# Patient Record
Sex: Male | Born: 1995 | Race: Black or African American | Hispanic: No | Marital: Single | Smoking: Current some day smoker
Health system: Southern US, Community
[De-identification: ages and names within clinical notes are randomized; demographics above are authoritative.]

## PROBLEM LIST (undated history)

## (undated) ENCOUNTER — Ambulatory Visit (HOSPITAL_COMMUNITY): Admission: EM | Payer: Self-pay | Source: Home / Self Care

---
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
## 2017-09-21 ENCOUNTER — Emergency Department (HOSPITAL_BASED_OUTPATIENT_CLINIC_OR_DEPARTMENT_OTHER): Payer: Self-pay

## 2017-09-21 ENCOUNTER — Encounter (HOSPITAL_BASED_OUTPATIENT_CLINIC_OR_DEPARTMENT_OTHER): Payer: Self-pay | Admitting: *Deleted

## 2017-09-21 ENCOUNTER — Other Ambulatory Visit: Payer: Self-pay

## 2017-09-21 ENCOUNTER — Emergency Department (HOSPITAL_BASED_OUTPATIENT_CLINIC_OR_DEPARTMENT_OTHER)
Admission: EM | Admit: 2017-09-21 | Discharge: 2017-09-22 | Disposition: A | Discharge status: Home / Self Care | Payer: Self-pay | Attending: Emergency Medicine | Admitting: Emergency Medicine

## 2017-09-21 DIAGNOSIS — F17200 Nicotine dependence, unspecified, uncomplicated: Secondary | ICD-10-CM | POA: Insufficient documentation

## 2017-09-21 DIAGNOSIS — R0602 Shortness of breath: Secondary | ICD-10-CM | POA: Insufficient documentation

## 2017-09-21 LAB — RAPID URINE DRUG SCREEN, HOSP PERFORMED
AMPHETAMINES: NOT DETECTED
BENZODIAZEPINES: NOT DETECTED
Cocaine: NOT DETECTED
OPIATES: NOT DETECTED
TETRAHYDROCANNABINOL: POSITIVE — AB

## 2017-09-21 LAB — CBC WITH DIFFERENTIAL/PLATELET
BASOS PCT: 1 %
Basophils Absolute: 0 10*3/uL (ref 0.0–0.1)
Eosinophils Absolute: 0.2 10*3/uL (ref 0.0–0.7)
Eosinophils Relative: 2 %
HEMATOCRIT: 39.1 % (ref 39.0–52.0)
HEMOGLOBIN: 14.4 g/dL (ref 13.0–17.0)
Lymphocytes Relative: 31 %
Lymphs Abs: 2 10*3/uL (ref 0.7–4.0)
MCH: 31.8 pg (ref 26.0–34.0)
MCHC: 36.8 g/dL — ABNORMAL HIGH (ref 30.0–36.0)
MCV: 86.3 fL (ref 78.0–100.0)
Monocytes Absolute: 0.8 10*3/uL (ref 0.1–1.0)
Monocytes Relative: 12 %
NEUTROS PCT: 54 %
Neutro Abs: 3.6 10*3/uL (ref 1.7–7.7)
Platelets: 206 10*3/uL (ref 150–400)
RBC: 4.53 MIL/uL (ref 4.22–5.81)
RDW: 11.8 % (ref 11.5–15.5)
WBC: 6.6 10*3/uL (ref 4.0–10.5)

## 2017-09-21 LAB — BASIC METABOLIC PANEL
Anion gap: 9 (ref 5–15)
BUN: 14 mg/dL (ref 6–20)
CALCIUM: 9.5 mg/dL (ref 8.9–10.3)
CHLORIDE: 101 mmol/L (ref 98–111)
CO2: 25 mmol/L (ref 22–32)
CREATININE: 1.06 mg/dL (ref 0.61–1.24)
GFR calc non Af Amer: >60 mL/min (ref >60)
Glucose, Bld: 82 mg/dL (ref 70–99)
Potassium: 3.5 mmol/L (ref 3.5–5.1)
SODIUM: 135 mmol/L (ref 135–145)

## 2017-09-21 LAB — D-DIMER, QUANTITATIVE: D-Dimer, Quant: 0.88 ug/mL-FEU — ABNORMAL HIGH (ref 0.00–0.50)

## 2017-09-21 LAB — TROPONIN I

## 2017-09-21 MED ORDER — IOPAMIDOL (ISOVUE-370) INJECTION 76%
100.0000 mL | Freq: Once | INTRAVENOUS | Status: AC | PRN
  Administered 2017-09-21: 100 mL via INTRAVENOUS

## 2017-09-21 NOTE — ED Provider Notes (Signed)
[facility] EMERGENCY DEPARTMENT Provider Note   CSN: [id number] Arrival date & time: 09/21/17  2027     History   Chief Complaint Chief Complaint  Patient presents with  . Shortness of Breath    HPI Jacob Gates is a 22 y.o. male who presents with SOB. No significant PMH. 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 He feels like he has to take deeper breaths to get in enough air. He went home from work and rested but still complained of chest pain and SOB to his mom therefore they decided to come to the ED. He also feels like he has chest congestion but denies fever, chills, URI symptoms, cough, or wheezing. He is an occasional smoker. He has had this symptom before a couple years ago which resolved on its own however he remembers having more cold symptoms at that time. No recent surgery/travel/immobilization, hx of cancer, leg swelling, hemptysis, prior DVT/PE, or hormone use.   HPI  History reviewed. No pertinent past medical history.  There are no active problems to display for this patient.   History reviewed. No pertinent surgical history.      Home Medications    Prior to Admission medications   Medication Sig Start Date End Date Taking? Authorizing Provider  naproxen sodium (ANAPROX) 220 MG tablet Take 440 mg by mouth 2 (two) times daily as needed (pain).    [provider]    Family History No family history on file.  Social History Social History   Tobacco Use  . Smoking status: Current Some Day Smoker  . Smokeless tobacco: Never Used  Substance Use Topics  . Alcohol use: No  . Drug use: No     Allergies   Other   Review of Systems Review of Systems  Constitutional: Negative for chills and fever.  HENT: Positive for congestion.   Respiratory: Positive for shortness of breath. Negative for cough, chest tightness and wheezing.   Cardiovascular:  Positive for palpitations. Negative for chest pain and leg swelling.  Gastrointestinal: Negative for abdominal pain.  Neurological: Negative for syncope.     Physical Exam Updated Vital Signs BP (!) 151/94 (BP Location: Right Arm)   Pulse 67   Temp 98.1 F (36.7 C) (Oral)   Resp 20   Ht 5\' 10"  (1.778 m)   Wt 83.5 kg (184 lb 1 oz)   SpO2 100%   BMI 26.41 kg/m   Physical Exam  Constitutional: He is oriented to person, place, and time. He appears well-developed and well-nourished. No distress.  HENT:  Head: Normocephalic and atraumatic.  Right Ear: Hearing, tympanic membrane, external ear and ear canal normal.  Left Ear: Hearing, tympanic membrane, external ear and ear canal normal.  Nose: Nose normal.  Mouth/Throat: Uvula is midline, oropharynx is clear and moist and mucous membranes are normal.  Eyes: Pupils are equal, round, and reactive to light. Conjunctivae are normal. Right eye exhibits no discharge. Left eye exhibits no discharge. No scleral icterus.  Neck: Normal range of motion.  Cardiovascular: Normal rate and regular rhythm.  Pulmonary/Chest: Effort normal and breath sounds normal. No respiratory distress.  Abdominal: He exhibits no distension.  Neurological: He is alert and oriented to person, place, and time.  Skin: Skin is warm and dry.  Psychiatric: He has a normal mood and affect. His behavior is normal.  Nursing note and vitals  reviewed.    ED Treatments / Results  Labs (all labs ordered are listed, but only abnormal results are displayed) Labs Reviewed  CBC WITH DIFFERENTIAL/PLATELET - Abnormal; Notable for the following components:      Result Value   MCHC 36.8 (*)    All other components within normal limits  D-DIMER, QUANTITATIVE (NOT AT [facility]) - Abnormal; Notable for the following components:   D-Dimer, Quant 0.88 (*)    All other components within normal limits  RAPID URINE DRUG SCREEN, HOSP PERFORMED - Abnormal; Notable for the following  components:   Tetrahydrocannabinol POSITIVE (*)    Barbiturates   (*)    Value: Result not available. Reagent lot number recalled by manufacturer.   All other components within normal limits  BASIC METABOLIC PANEL  TROPONIN I    EKG EKG Interpretation  Date/Time:  Thursday September 21 2017 21:35:17 EDT Ventricular Rate:  69 PR Interval:    QRS Duration: 84 QT Interval:  383 QTC Calculation: 411 R Axis:   43 Text Interpretation:  Sinus rhythm Repol abnrm suggests ischemia, inferior leads Borderline ST elevation, anterolateral leads No previous tracing Confirmed by Gwyneth SproutPlunkett, Whitney ([id number]) on 09/21/2017 9:47:40 PM   Radiology Dg Chest 2 View  Result Date: 09/21/2017 CLINICAL DATA:  Chest mucus and difficulty breathing x4 hours. EXAM: CHEST - 2 VIEW COMPARISON:  None. FINDINGS: The heart size and mediastinal contours are within normal limits. Both lungs are clear. The visualized skeletal structures are unremarkable. IMPRESSION: No active cardiopulmonary disease. Electronically Signed   By: Tollie Ethavid  Kwon M.D.   On: 09/21/2017 21:53   Ct Angio Chest Pe W/cm &/or Wo Cm  Result Date: 09/21/2017 CLINICAL DATA:  Difficulty breathing, mucus with congestion x7 hours. Unable cough. EXAM: CT ANGIOGRAPHY CHEST WITH CONTRAST TECHNIQUE: Multidetector CT imaging of the chest was performed using the standard protocol during bolus administration of intravenous contrast. Multiplanar CT image reconstructions and MIPs were obtained to evaluate the vascular anatomy. CONTRAST:  100mL ISOVUE-370 IOPAMIDOL (ISOVUE-370) INJECTION 76% COMPARISON:  None. FINDINGS: Cardiovascular: The study is of quality for the evaluation of pulmonary embolism. There are no filling defects in the central, lobar, segmental or subsegmental pulmonary artery branches to suggest acute pulmonary embolism. Great vessels are normal in course and caliber. Normal heart size. No significant pericardial fluid/thickening. Mediastinum/Nodes: No  discrete thyroid nodules. Unremarkable esophagus. No pathologically enlarged axillary, mediastinal or hilar lymph nodes. Lungs/Pleura: No pneumothorax. No pleural effusion. Lungs are clear without air trapping. No peribronchial thickening is identified. No mucus inspissation is seen. Upper abdomen: Unremarkable. Musculoskeletal:  No aggressive appearing focal osseous lesions. Review of the MIP images confirms the above findings. IMPRESSION: No pulmonary embolus. No acute pulmonary edema or consolidation. No pneumothorax. Electronically Signed   By: Tollie Ethavid  Kwon M.D.   On: 09/21/2017 23:09    Procedures Procedures (including critical care time)  Medications Ordered in ED Medications - No data to display   Initial Impression / Assessment and Plan / ED Course  I have reviewed the triage vital signs and the nursing notes.  Pertinent labs & imaging results that were available during my care of the patient were reviewed by me and considered in my medical decision making (see chart for details).  22 year old male presents with chest pain/SOB for one day. He is mildly hypertensive but otherwise vitals are normal. He is very well appearing on exam. Heart is regular rate and rhythm. Lungs are CTA. Unclear etiology of his symptoms. He reports congestion  but has not had a fever, URI symptoms or cough. He denies chest pain to me although mom states he told her about chest pain. He is PERC negative so I have low suspicion for PE. Will obtain EKG and CXR  EKG is SR with ST depressions. Discussed with Dr. Anitra Lauth. Will add labs, troponin, d-dimer. CXR is negative  Labs are normal. Troponin is normal. D-dimer is mildly elevated. UDS positive for THC. CTA chest ordered.  CT is negative for PE, pneumonia, pneumothorax. Discussed results with the patient and his mother. Ambulatory sats stayed >90%. We will have him try OTC cough/cold medicine to see if this helps his symptoms. They are agreeable.  Final Clinical  Impressions(s) / ED Diagnoses   Final diagnoses:  Shortness of breath    ED Discharge Orders    None       Bethel Born, PA-C 09/21/17 2348    Gwyneth Sprout, MD 09/22/17 1341

## 2017-09-21 NOTE — ED Notes (Signed)
Patient transported to X-ray 

## 2017-09-21 NOTE — Discharge Instructions (Signed)
Please try over the counter medicines for cough/cold such as Mucinex or Robitussin  Return if you are worsening

## 2017-09-21 NOTE — ED Triage Notes (Signed)
States he has chest mucus but can't cough it up. States when he inhales and exhales it takes more effort than normal for him. No distress.

## 2017-09-21 NOTE — ED Notes (Signed)
Ambulated patient 2 laps around the department; o2 sats varied from 90-100 % on room air; no acute distress noted during ambulation.

## 2018-02-09 ENCOUNTER — Ambulatory Visit (HOSPITAL_COMMUNITY)
Admission: EM | Admit: 2018-02-09 | Discharge: 2018-02-09 | Disposition: A | Discharge status: Home / Self Care | Payer: Self-pay | Attending: Internal Medicine | Admitting: Internal Medicine

## 2018-02-09 ENCOUNTER — Other Ambulatory Visit: Payer: Self-pay

## 2018-02-09 ENCOUNTER — Encounter (HOSPITAL_COMMUNITY): Payer: Self-pay | Admitting: Emergency Medicine

## 2018-02-09 ENCOUNTER — Ambulatory Visit (INDEPENDENT_AMBULATORY_CARE_PROVIDER_SITE_OTHER): Payer: Self-pay

## 2018-02-09 DIAGNOSIS — R079 Chest pain, unspecified: Secondary | ICD-10-CM

## 2018-02-09 DIAGNOSIS — F17210 Nicotine dependence, cigarettes, uncomplicated: Secondary | ICD-10-CM

## 2018-02-09 DIAGNOSIS — R0602 Shortness of breath: Secondary | ICD-10-CM

## 2018-02-09 MED ORDER — OMEPRAZOLE 20 MG PO CPDR: 20.0000 mg | DELAYED_RELEASE_CAPSULE | Freq: Every day | ORAL | 0 refills | Status: AC

## 2018-02-09 NOTE — ED Provider Notes (Signed)
[facility]    CSN: [id number] Arrival date & time: 02/09/18  1209     History   Chief Complaint Chief Complaint  Patient presents with  . Chest Pain  . diaphoresis    HPI Jacob Gates is a 22 y.o. male.   Shaarav presents with complaints of sternal chest pain which radiates to his back, sharp. Has been intermittent for the past 1-2 weeks. Seems to be worsening. Denies current pain. Smoking worsens it. Smokes approximately every other day. He has been working out, feels this helps his symptoms. While working out he feels his heart will beat fast, but improves with rest. No abdominal pain. No nausea or vomiting. Sometimes will feel shortness of breath like he needs to take a deep breath. Eating doesn't worsen symptoms. Doesn't wake him from sleep or get worse at night. No cough or congestion. No sore throat. Denies  Recent travel. No leg pain or swelling. No personal or family cardiac history or history of blood clots. Denies any previous similar, however on chart review 08/2017 appears to have had similar complaint with troponins, ekg and CTA, all which were negative. Hasn't taken any medications for symptoms. States he does get sweaty at night at times. Denies use of illicit drugs, doesn't drink alcohol. Takes biotin, no other medications. Without contributing medical history.       ROS per HPI.      History reviewed. No pertinent past medical history.  There are no active problems to display for this patient.   History reviewed. No pertinent surgical history.     Home Medications    Prior to Admission medications   Medication Sig Start Date End Date Taking? Authorizing Provider  Biotin 1 MG CAPS Take by mouth.   Yes [provider]  naproxen sodium (ANAPROX) 220 MG tablet Take 440 mg by mouth 2 (two) times daily as needed (pain).    [provider]  omeprazole (PRILOSEC) 20 MG capsule Take 1 capsule (20 mg total) by mouth daily. 02/09/18    Georgetta Haber, NP    Family History History reviewed. No pertinent family history.  Social History Social History   Tobacco Use  . Smoking status: Current Some Day Smoker  . Smokeless tobacco: Never Used  Substance Use Topics  . Alcohol use: No  . Drug use: Yes    Frequency: 4.0 times per week    Types: Marijuana     Allergies   Other   Review of Systems Review of Systems   Physical Exam Triage Vital Signs ED Triage Vitals  Enc Vitals Group     BP 02/09/18 1221 (!) 144/82     Pulse Rate 02/09/18 1221 92     Resp --      Temp 02/09/18 1221 97.7 F (36.5 C)     Temp Source 02/09/18 1221 Oral     SpO2 02/09/18 1221 98 %     Weight --      Height --      Head Circumference --      Peak Flow --      Pain Score 02/09/18 1219 0     Pain Loc --      Pain Edu? --      Excl. in GC? --    No data found.  Updated Vital Signs BP (!) 144/82 (BP Location: Left Arm)   Pulse 92   Temp 97.7 F (36.5 C) (Oral)   SpO2 98%    Physical  Exam  Constitutional: He is oriented to person, place, and time. He appears well-developed and well-nourished.  Cardiovascular: Normal rate, regular rhythm and normal pulses.  Pulmonary/Chest: Effort normal and breath sounds normal. He exhibits no tenderness.  Abdominal: Soft. Bowel sounds are normal. There is no tenderness.  Neurological: He is alert and oriented to person, place, and time.  Skin: Skin is warm and dry.   EKG NSR rate 70. stelvation noted, does not appear changed from previous ekg, appears consistent with early repolarization  UC Treatments / Results  Labs (all labs ordered are listed, but only abnormal results are displayed) Labs Reviewed - No data to display  EKG None  Radiology Dg Chest 2 View  Result Date: 02/09/2018 CLINICAL DATA:  Chest pain. EXAM: CHEST - 2 VIEW COMPARISON:  CT 09/21/2017. FINDINGS: Mediastinum and hilar structures normal. Lungs are clear. No focal infiltrate. No pleural effusion or  pneumothorax. Heart size normal. No acute bony abnormality. IMPRESSION: No acute cardiopulmonary disease. Electronically Signed   By: Maisie Fushomas  Register   On: 02/09/2018 13:43    Procedures Procedures (including critical care time)  Medications Ordered in UC Medications - No data to display  Initial Impression / Assessment and Plan / UC Course  I have reviewed the triage vital signs and the nursing notes.  Pertinent labs & imaging results that were available during my care of the patient were reviewed by me and considered in my medical decision making (see chart for details).     Non toxic in appearance. No current symptoms. Appears to have had similar symptoms in June of this year with negative work up in ER. No changes to ekg here today, chest xray reassuring. In discussed with patient he later then endorses some reflux symptoms, will start omeprazole daily to see if this helps with symptoms. Do recommend follow up with PCP for recheck as may need cardiology referral if symptoms persist. Return precautions provided. Patient verbalized understanding and agreeable to plan.  Ambulatory out of clinic without difficulty.    Final Clinical Impressions(s) / UC Diagnoses   Final diagnoses:  Nonspecific chest pain     Discharge Instructions     Your ekg is unchanged from your last visit. Chest xray without acute abnormalities at this time.  Reflux symptoms can at times cause chest pain which radiates to back, therefore I do think its worth starting daily omeprazole.  Please follow up with a primary care provider for recheck of your symptoms as you may need referral to cardiology for further evaluation.  If develop worsening of pain, shortness of breath , difficulty breathing, feeling like will pass out, sweating at time of chest pain, or otherwise worsening please go to the Er.     ED Prescriptions    Medication Sig Dispense Auth. Provider   omeprazole (PRILOSEC) 20 MG capsule Take 1  capsule (20 mg total) by mouth daily. 30 capsule Georgetta HaberBurky, Natalie B, NP     Controlled Substance Prescriptions [place] Controlled Substance Registry consulted? Not Applicable   Georgetta HaberBurky, Natalie B, NP 02/09/18 1354

## 2018-02-09 NOTE — Discharge Instructions (Addendum)
Your ekg is unchanged from your last visit. Chest xray without acute abnormalities at this time.  Reflux symptoms can at times cause chest pain which radiates to back, therefore I do think its worth starting daily omeprazole.  Please follow up with a primary care provider for recheck of your symptoms as you may need referral to cardiology for further evaluation.  If develop worsening of pain, shortness of breath , difficulty breathing, feeling like will pass out, sweating at time of chest pain, or otherwise worsening please go to the Er.

## 2018-02-09 NOTE — ED Triage Notes (Signed)
Pt reports atypical intermittent centralized chest pain that sometimes radiates to his back for two weeks.  He describes them as sharp and lasting about 5-10 minutes.  He will drink water and rest and they will go away.  He also reports waking up diaphoretic but not related to the CP.  He also feels like he needs to take real deep breaths to catch his breath, but again, not related to the CP

## 2018-09-21 ENCOUNTER — Emergency Department (HOSPITAL_COMMUNITY)
Admission: EM | Admit: 2018-09-21 | Discharge: 2018-09-21 | Disposition: A | Discharge status: Home / Self Care | Payer: BLUE CROSS/BLUE SHIELD | Attending: Emergency Medicine | Admitting: Emergency Medicine

## 2018-09-21 ENCOUNTER — Encounter (HOSPITAL_COMMUNITY): Payer: Self-pay | Admitting: Emergency Medicine

## 2018-09-21 ENCOUNTER — Other Ambulatory Visit: Payer: Self-pay

## 2018-09-21 DIAGNOSIS — F1210 Cannabis abuse, uncomplicated: Secondary | ICD-10-CM | POA: Insufficient documentation

## 2018-09-21 DIAGNOSIS — Z202 Contact with and (suspected) exposure to infections with a predominantly sexual mode of transmission: Secondary | ICD-10-CM

## 2018-09-21 DIAGNOSIS — F17200 Nicotine dependence, unspecified, uncomplicated: Secondary | ICD-10-CM | POA: Diagnosis not present

## 2018-09-21 DIAGNOSIS — Z79899 Other long term (current) drug therapy: Secondary | ICD-10-CM | POA: Diagnosis not present

## 2018-09-21 MED ORDER — CEFTRIAXONE SODIUM 250 MG IJ SOLR
250.0000 mg | Freq: Once | INTRAMUSCULAR | Status: AC
  Administered 2018-09-21: 08:00:00 250 mg via INTRAMUSCULAR
  Filled 2018-09-21: qty 250

## 2018-09-21 MED ORDER — LIDOCAINE HCL (PF) 1 % IJ SOLN
INTRAMUSCULAR | Status: AC
  Filled 2018-09-21: qty 5

## 2018-09-21 MED ORDER — METRONIDAZOLE 500 MG PO TABS
2000.0000 mg | ORAL_TABLET | Freq: Once | ORAL | Status: AC
  Administered 2018-09-21: 2000 mg via ORAL
  Filled 2018-09-21: qty 4

## 2018-09-21 MED ORDER — AZITHROMYCIN 250 MG PO TABS
1000.0000 mg | ORAL_TABLET | Freq: Once | ORAL | Status: AC
  Administered 2018-09-21: 1000 mg via ORAL
  Filled 2018-09-21: qty 4

## 2018-09-21 NOTE — ED Triage Notes (Signed)
Pt reports girlfriend with chlamydia infection. Would like STD testing.

## 2018-09-21 NOTE — ED Provider Notes (Signed)
[facility] EMERGENCY DEPARTMENT Provider Note   CSN: [id number] Arrival date & time: 09/21/18  4709     History   Chief Complaint Chief Complaint  Patient presents with  . Exposure to STD    HPI Jacob Gates is a 23 y.o. male.     HPI Patient presents to the emergency department with exposure to chlamydia.  Patient states his girlfriend informed him that she was tested and was positive for chlamydia.  Patient states that nothing seems to make the condition better or worse.  The patient states he he has had no symptoms at this time.  Patient denies abdominal pain, nausea, vomiting, dysuria,, weakness, dizziness, near-syncope or syncope. History reviewed. No pertinent past medical history.  There are no active problems to display for this patient.   History reviewed. No pertinent surgical history.      Home Medications    Prior to Admission medications   Medication Sig Start Date End Date Taking? Authorizing Provider  Biotin 1 MG CAPS Take by mouth.    [provider]  naproxen sodium (ANAPROX) 220 MG tablet Take 440 mg by mouth 2 (two) times daily as needed (pain).    [provider]  omeprazole (PRILOSEC) 20 MG capsule Take 1 capsule (20 mg total) by mouth daily. 02/09/18   Zigmund Gottron, NP    Family History History reviewed. No pertinent family history.  Social History Social History   Tobacco Use  . Smoking status: Current Some Day Smoker  . Smokeless tobacco: Never Used  Substance Use Topics  . Alcohol use: No  . Drug use: Yes    Frequency: 4.0 times per week    Types: Marijuana     Allergies   Other   Review of Systems Review of Systems All other systems negative except as documented in the HPI. All pertinent positives and negatives as reviewed in the HPI.  Physical Exam Updated Vital Signs BP 140/89 (BP Location: Right Arm)   Pulse 62   Temp 98.9 F (37.2 C) (Oral)   Resp 16   Ht 5\' 11"  (1.803 m)    Wt 88.5 kg   SpO2 100%   BMI 27.20 kg/m   Physical Exam Vitals signs and nursing note reviewed.  Constitutional:      General: He is not in acute distress.    Appearance: He is well-developed.  HENT:     Head: Normocephalic and atraumatic.  Eyes:     Pupils: Pupils are equal, round, and reactive to light.  Pulmonary:     Effort: Pulmonary effort is normal.  Genitourinary:    Penis: Normal.      Scrotum/Testes: Normal.  Skin:    General: Skin is warm and dry.  Neurological:     Mental Status: He is alert and oriented to person, place, and time.      ED Treatments / Results  Labs (all labs ordered are listed, but only abnormal results are displayed) Labs Reviewed - No data to display  EKG    Radiology No results found.  Procedures Procedures (including critical care time)  Medications Ordered in ED Medications - No data to display   Initial Impression / Assessment and Plan / ED Course  I have reviewed the triage vital signs and the nursing notes.  Pertinent labs & imaging results that were available during my care of the patient were reviewed by me and considered in my medical decision making (see chart for details).  The patient will be empirically treated for STDs.  Have advised patient to return here for any worsening in his condition.  Splane to the patient that he will need to see him sexual contact for the next 7 days.  Final Clinical Impressions(s) / ED Diagnoses   Final diagnoses:  None    ED Discharge Orders    None       Charlestine NightLawyer, Tai Skelly, PA-C 09/22/18 [id number]    Pricilla LovelessGoldston, Scott, MD 09/25/18 [phone]

## 2018-09-21 NOTE — Discharge Instructions (Addendum)
Return here as needed.  Follow-up with the health department as needed.  Avoid any sexual contact over the next 7 days.

## 2018-09-25 ENCOUNTER — Other Ambulatory Visit: Payer: Self-pay

## 2018-09-25 ENCOUNTER — Emergency Department (HOSPITAL_COMMUNITY)
Admission: EM | Admit: 2018-09-25 | Discharge: 2018-09-25 | Disposition: A | Discharge status: Home / Self Care | Payer: BLUE CROSS/BLUE SHIELD | Attending: Emergency Medicine | Admitting: Emergency Medicine

## 2018-09-25 DIAGNOSIS — F17200 Nicotine dependence, unspecified, uncomplicated: Secondary | ICD-10-CM | POA: Diagnosis not present

## 2018-09-25 DIAGNOSIS — R197 Diarrhea, unspecified: Secondary | ICD-10-CM | POA: Insufficient documentation

## 2018-09-25 LAB — CBC WITH DIFFERENTIAL/PLATELET
Abs Immature Granulocytes: 0.01 10*3/uL (ref 0.00–0.07)
Basophils Absolute: 0 10*3/uL (ref 0.0–0.1)
Basophils Relative: 0 %
Eosinophils Absolute: 0.2 10*3/uL (ref 0.0–0.5)
Eosinophils Relative: 3 %
HCT: 41.7 % (ref 39.0–52.0)
Hemoglobin: 14.6 g/dL (ref 13.0–17.0)
Immature Granulocytes: 0 %
Lymphocytes Relative: 19 %
Lymphs Abs: 1.2 10*3/uL (ref 0.7–4.0)
MCH: 31.3 pg (ref 26.0–34.0)
MCHC: 35 g/dL (ref 30.0–36.0)
MCV: 89.5 fL (ref 80.0–100.0)
Monocytes Absolute: 0.6 10*3/uL (ref 0.1–1.0)
Monocytes Relative: 10 %
Neutro Abs: 4.2 10*3/uL (ref 1.7–7.7)
Neutrophils Relative %: 68 %
Platelets: 209 10*3/uL (ref 150–400)
RBC: 4.66 MIL/uL (ref 4.22–5.81)
RDW: 12 % (ref 11.5–15.5)
WBC: 6.2 10*3/uL (ref 4.0–10.5)
nRBC: 0 % (ref 0.0–0.2)

## 2018-09-25 LAB — COMPREHENSIVE METABOLIC PANEL
ALT: 18 U/L (ref 0–44)
AST: 20 U/L (ref 15–41)
Albumin: 4.2 g/dL (ref 3.5–5.0)
Alkaline Phosphatase: 36 U/L — ABNORMAL LOW (ref 38–126)
Anion gap: 8 (ref 5–15)
BUN: 13 mg/dL (ref 6–20)
CO2: 23 mmol/L (ref 22–32)
Calcium: 9.2 mg/dL (ref 8.9–10.3)
Chloride: 105 mmol/L (ref 98–111)
Creatinine, Ser: 1.02 mg/dL (ref 0.61–1.24)
GFR calc Af Amer: >60 mL/min (ref >60)
GFR calc non Af Amer: >60 mL/min (ref >60)
Glucose, Bld: 97 mg/dL (ref 70–99)
Potassium: 4.3 mmol/L (ref 3.5–5.1)
Sodium: 136 mmol/L (ref 135–145)
Total Bilirubin: 1.2 mg/dL (ref 0.3–1.2)
Total Protein: 7.6 g/dL (ref 6.5–8.1)

## 2018-09-25 LAB — GC/CHLAMYDIA PROBE AMP (~~LOC~~) NOT AT ARMC
Chlamydia: NEGATIVE
Neisseria Gonorrhea: NEGATIVE

## 2018-09-25 LAB — C DIFFICILE QUICK SCREEN W PCR REFLEX
C Diff antigen: NEGATIVE
C Diff interpretation: NOT DETECTED
C Diff toxin: NEGATIVE

## 2018-09-25 MED ORDER — LOPERAMIDE HCL 2 MG PO CAPS: 2.0000 mg | ORAL_CAPSULE | Freq: Four times a day (QID) | ORAL | 0 refills | Status: AC | PRN

## 2018-09-25 MED ORDER — SODIUM CHLORIDE 0.9 % IV BOLUS
1000.0000 mL | Freq: Once | INTRAVENOUS | Status: AC
  Administered 2018-09-25: 1000 mL via INTRAVENOUS

## 2018-09-25 NOTE — ED Triage Notes (Signed)
Seen here 5 days ago with c/o STD sx, was treated and tested via urine.  States since then has had itching to anus without noted pain.   Noted diarrhea, liquid stools yesterday 5-6, 2 this AM.  Denies any abd pain.

## 2018-09-25 NOTE — ED Provider Notes (Signed)
[facility] Provider Note   CSN: [id number] Arrival date & time: 09/25/18  0707    History   Chief Complaint No chief complaint on file.   HPI Rogue BussingGeorge Gates is a 23 y.o. male presenting to the ED with complaints of multiple episodes of watery diarrhea that began yesterday. Pt reports he had 4-5 episodes yesterday and 2 this AM, prompting him to come to the ED. He is also complaining of itching to his rectum that began shortly after the diarrhea. He does not partake in anal intercourse. He has not taken anything for his symptoms. Per chart review: Pt seen in the ED on 06/26 after possible STD exposure where his girlfriend tested positive for chlamydia. He was empirically treated with azithromycin, ceftriaxone, and flagyl. Pt states he has never taken these antibiotics in the past. Denies fever, chills, abdominal pain, nausea, vomiting, urinary symptoms, penile symptoms, or any other associated symptoms.       No past medical history on file.  There are no active problems to display for this patient.   No past surgical history on file.      Home Medications    Prior to Admission medications   Medication Sig Start Date End Date Taking? Authorizing Provider  Biotin 1 MG CAPS Take by mouth.    [provider]  loperamide (IMODIUM) 2 MG capsule Take 1 capsule (2 mg total) by mouth 4 (four) times daily as needed for diarrhea or loose stools. 09/25/18   Hyman HopesVenter, Lesley Atkin, PA-C  naproxen sodium (ANAPROX) 220 MG tablet Take 440 mg by mouth 2 (two) times daily as needed (pain).    [provider]  omeprazole (PRILOSEC) 20 MG capsule Take 1 capsule (20 mg total) by mouth daily. 02/09/18   Georgetta HaberBurky, Natalie B, NP    Family History No family history on file.  Social History Social History   Tobacco Use  . Smoking status: Current Some Day Smoker  . Smokeless tobacco: Never Used  Substance Use Topics  . Alcohol use: No  . Drug use:  Yes    Frequency: 4.0 times per week    Types: Marijuana     Allergies   Other   Review of Systems Review of Systems  Constitutional: Negative for chills and fever.  HENT: Negative for congestion.   Eyes: Negative for pain.  Respiratory: Negative for cough.   Cardiovascular: Negative for chest pain.  Gastrointestinal: Positive for diarrhea. Negative for abdominal pain, blood in stool, nausea and vomiting.  Genitourinary: Negative for difficulty urinating, discharge, frequency, penile pain, scrotal swelling and testicular pain.  Musculoskeletal: Negative for myalgias.  Skin: Negative for rash.  Neurological: Negative for headaches.     Physical Exam Updated Vital Signs BP 126/80 (BP Location: Right Arm)   Pulse 60   Temp 98.6 F (37 C) (Oral)   Resp 16   Ht 5\' 11"  (1.803 m)   Wt 88.5 kg   SpO2 100%   BMI 27.20 kg/m   Physical Exam Vitals signs and nursing note reviewed. Exam conducted with a chaperone present.  Constitutional:      Appearance: He is not ill-appearing.  HENT:     Head: Normocephalic and atraumatic.  Eyes:     Conjunctiva/sclera: Conjunctivae normal.  Neck:     Musculoskeletal: Neck supple.  Cardiovascular:     Rate and Rhythm: Normal rate and regular rhythm.     Pulses: Normal pulses.  Pulmonary:     Effort: Pulmonary effort  is normal.     Breath sounds: Normal breath sounds. No wheezing, rhonchi or rales.  Abdominal:     Palpations: Abdomen is soft.     Tenderness: There is no abdominal tenderness. There is no guarding or rebound.  Genitourinary:    Rectum: Normal.  Skin:    General: Skin is warm and dry.  Neurological:     Mental Status: He is alert.      ED Treatments / Results  Labs (all labs ordered are listed, but only abnormal results are displayed) Labs Reviewed  COMPREHENSIVE METABOLIC PANEL - Abnormal; Notable for the following components:      Result Value   Alkaline Phosphatase 36 (*)    All other components within  normal limits  C DIFFICILE QUICK SCREEN W PCR REFLEX  CBC WITH DIFFERENTIAL/PLATELET    EKG None  Radiology No results found.  Procedures Procedures (including critical care time)  Medications Ordered in ED Medications  sodium chloride 0.9 % bolus 1,000 mL (0 mLs Intravenous Stopped 09/25/18 0930)     Initial Impression / Assessment and Plan / ED Course  I have reviewed the triage vital signs and the nursing notes.  Pertinent labs & imaging results that were available during my care of the patient were reviewed by me and considered in my medical decision making (see chart for details).  Clinical Course as of Sep 24 1036  Tue Sep 25, 2018  1015 negative  C difficile quick scan w PCR reflex [MV]    Clinical Course User Index [MV] Eustaquio Maize, PA-C   23 year old male presenting to the ED with multiple episodes of watery nonbloody diarrhea that began yesterday; recently treated with antibiotics 4 days ago after possible STD exposure. Treated with rocephin, azithromycin, and flagyl. Only other complaint today includes itching to his rectum that started after the diarrhea began; no abnormalities visualized during exam; suspect this is from excessive wetness in the area from diarrhea. Given recently treated with abx; concern for mild c diff. Pt placed on enteric precautions; will collect stool sample today to check c diff toxin. Baseline labs including CBC and CMP to check for electrolyte abnormalities. 1 L NS bolus given for comfort as well. Pt is not complaining of abdominal pain, nausea, or vomiting. He is non tender on exam; do not feel he needs any imaging today.   Labwork reassuring today; c diff toxin negative. No electrolyte derangements or leukocytosis. Will discharge patient home with imodium for symptomatic relief; do not suspect infectious diarrhea at this point; no blood in stool and pt afebrile without complaints of fever/chills. Will have him follow up with [facility] as needed for primary care needs. Advised to return to the ED for any worsening symptoms. Pt is in agreement with plan at this time and stable for discharge home.        Final Clinical Impressions(s) / ED Diagnoses   Final diagnoses:  Diarrhea, unspecified type    ED Discharge Orders         Ordered    loperamide (IMODIUM) 2 MG capsule  4 times daily PRN     09/25/18 1035           Eustaquio Maize, PA-C 09/25/18 1417    Jola Schmidt, MD 09/25/18 1625

## 2018-09-25 NOTE — Discharge Instructions (Signed)
You were seen in the ED today for diarrhea; your labwork was reassuring today including negative for c diff (a toxin that you can get after taking antibiotics). Please take the antidiarrheal medicine as needed for your symptoms. Please try to stay hydrated and drink plenty of fluids including water and drinks like gatorade and powerade. You can follow up with [facility] as needed for your primary care needs. Return to the ED for any worsening symptoms including blood in your stool, fever > 100.4, abdominal pain.

## 2018-09-25 NOTE — ED Notes (Signed)
Pt c/o irritation to IV site multiple times.  No s/sx of infiltration.   Reviewed lab results and will wait to dc until pt dispo.  Will discuss with provider.

## 2018-11-19 ENCOUNTER — Other Ambulatory Visit: Payer: Self-pay

## 2018-11-19 ENCOUNTER — Encounter (HOSPITAL_COMMUNITY): Payer: Self-pay | Admitting: Emergency Medicine

## 2018-11-19 ENCOUNTER — Emergency Department (HOSPITAL_COMMUNITY)
Admission: EM | Admit: 2018-11-19 | Discharge: 2018-11-20 | Disposition: A | Discharge status: Home / Self Care | Payer: BC Managed Care – PPO | Attending: Emergency Medicine | Admitting: Emergency Medicine

## 2018-11-19 DIAGNOSIS — N342 Other urethritis: Secondary | ICD-10-CM | POA: Insufficient documentation

## 2018-11-19 DIAGNOSIS — F1290 Cannabis use, unspecified, uncomplicated: Secondary | ICD-10-CM | POA: Insufficient documentation

## 2018-11-19 DIAGNOSIS — F17210 Nicotine dependence, cigarettes, uncomplicated: Secondary | ICD-10-CM | POA: Diagnosis not present

## 2018-11-19 DIAGNOSIS — Z79899 Other long term (current) drug therapy: Secondary | ICD-10-CM | POA: Insufficient documentation

## 2018-11-19 DIAGNOSIS — Z202 Contact with and (suspected) exposure to infections with a predominantly sexual mode of transmission: Secondary | ICD-10-CM | POA: Diagnosis present

## 2018-11-19 LAB — URINALYSIS, ROUTINE W REFLEX MICROSCOPIC
Bilirubin Urine: NEGATIVE
Glucose, UA: NEGATIVE mg/dL
Hgb urine dipstick: NEGATIVE
Ketones, ur: NEGATIVE mg/dL
Leukocytes,Ua: NEGATIVE
Nitrite: NEGATIVE
Protein, ur: NEGATIVE mg/dL
Specific Gravity, Urine: 1.017 (ref 1.005–1.030)
pH: 6 (ref 5.0–8.0)

## 2018-11-19 NOTE — ED Triage Notes (Signed)
Patient reports unprotected sexual encounter this week , requesting STD screening , reports penile pain , no penile discharge or dysuria .

## 2018-11-20 LAB — RAPID HIV SCREEN (HIV 1/2 AB+AG)
HIV 1/2 Antibodies: NONREACTIVE
HIV-1 P24 Antigen - HIV24: NONREACTIVE

## 2018-11-20 LAB — RPR: RPR Ser Ql: NONREACTIVE

## 2018-11-20 MED ORDER — LIDOCAINE HCL (PF) 1 % IJ SOLN
INTRAMUSCULAR | Status: AC
  Administered 2018-11-20: 5 mL
  Filled 2018-11-20: qty 5

## 2018-11-20 MED ORDER — CEFTRIAXONE SODIUM 250 MG IJ SOLR
250.0000 mg | Freq: Once | INTRAMUSCULAR | Status: AC
  Administered 2018-11-20: 250 mg via INTRAMUSCULAR
  Filled 2018-11-20: qty 250

## 2018-11-20 MED ORDER — AZITHROMYCIN 250 MG PO TABS
1000.0000 mg | ORAL_TABLET | Freq: Once | ORAL | Status: AC
  Administered 2018-11-20: 1000 mg via ORAL
  Filled 2018-11-20: qty 4

## 2018-11-20 NOTE — ED Notes (Signed)
Patient verbalizes understanding of discharge instructions. Opportunity for questioning and answers were provided. Armband removed by staff, pt discharged from ED, ambulatory. 

## 2018-11-20 NOTE — ED Provider Notes (Signed)
[facility] Provider [facility]   CSN: [id number] Arrival date & time: 11/19/18  2240     History   Chief Complaint Chief Complaint  Patient presents with  . Exposure to STD    HPI Jacob BussingGeorge Gates is a 23 y.o. male.     Patient presents to the emergency department with a chief complaint of requesting STD check.  He states that he has some tingling in his penis after having had intercourse with a new partner.  He denies any discharge.  Denies any fevers, chills, nausea, or vomiting.  Denies any scrotal pain or testicular tenderness.  Denies any new masses or lesions.  The history is provided by the patient. No language interpreter was used.    History reviewed. No pertinent past medical history.  There are no active problems to display for this patient.   History reviewed. No pertinent surgical history.      Home Medications    Prior to Admission medications   Medication Sig Start Date End Date Taking? Authorizing Provider  Biotin 1 MG CAPS Take by mouth.    [provider]  loperamide (IMODIUM) 2 MG capsule Take 1 capsule (2 mg total) by mouth 4 (four) times daily as needed for diarrhea or loose stools. 09/25/18   Hyman HopesVenter, Margaux, PA-C  naproxen sodium (ANAPROX) 220 MG tablet Take 440 mg by mouth 2 (two) times daily as needed (pain).    [provider]  omeprazole (PRILOSEC) 20 MG capsule Take 1 capsule (20 mg total) by mouth daily. 02/09/18   Georgetta HaberBurky, Natalie B, NP    Family History No family history on file.  Social History Social History   Tobacco Use  . Smoking status: Current Some Day Smoker  . Smokeless tobacco: Never Used  Substance Use Topics  . Alcohol use: No  . Drug use: Yes    Frequency: 4.0 times per week    Types: Marijuana     Allergies   Other   Review of Systems Review of Systems  All other systems reviewed and are negative.    Physical Exam Updated Vital Signs BP 128/85   Pulse 84    Temp 97.8 F (36.6 C) (Oral)   Resp 18   SpO2 99%   Physical Exam Vitals signs and nursing note reviewed.  Constitutional:      General: He is not in acute distress.    Appearance: He is well-developed. He is not ill-appearing.  HENT:     Head: Normocephalic and atraumatic.  Eyes:     Conjunctiva/sclera: Conjunctivae normal.  Neck:     Musculoskeletal: Neck supple.  Cardiovascular:     Rate and Rhythm: Normal rate.  Pulmonary:     Effort: Pulmonary effort is normal. No respiratory distress.  Abdominal:     General: There is no distension.  Genitourinary:    Comments: Circumcised male, no lesions, no masses, no discharge Musculoskeletal:     Comments: Moves all extremities  Skin:    General: Skin is warm and dry.  Neurological:     Mental Status: He is alert and oriented to person, place, and time.  Psychiatric:        Mood and Affect: Mood normal.        Behavior: Behavior normal.      ED Treatments / Results  Labs (all labs ordered are listed, but only abnormal results are displayed) Labs Reviewed  URINALYSIS, ROUTINE W REFLEX MICROSCOPIC  RAPID HIV SCREEN (HIV 1/2  AB+AG)  RPR  GC/CHLAMYDIA PROBE AMP ([place]) NOT AT [facility]    EKG None  Radiology No results found.  Procedures Procedures (including critical care time)  Medications Ordered in ED Medications  cefTRIAXone (ROCEPHIN) injection 250 mg (has no administration in time range)  azithromycin (ZITHROMAX) tablet 1,000 mg (has no administration in time range)     Initial Impression / Assessment and Plan / ED Course  I have reviewed the triage vital signs and the nursing notes.  Pertinent labs & imaging results that were available during my care of the patient were reviewed by me and considered in my medical decision making (see chart for details).        Patient here for some tingling with urination.  UA unremarkable.  STD tests pending.  Will treat with Rocephin and azithromycin due to  symptoms.  Final Clinical Impressions(s) / ED Diagnoses   Final diagnoses:  Urethritis    ED Discharge Orders    None       Montine Circle, PA-C 11/20/18 0031    Ezequiel Essex, MD 11/20/18 [phone]

## 2018-11-21 LAB — GC/CHLAMYDIA PROBE AMP (~~LOC~~) NOT AT ARMC
Chlamydia: NEGATIVE
Neisseria Gonorrhea: NEGATIVE

## 2019-02-09 ENCOUNTER — Emergency Department (HOSPITAL_COMMUNITY)
Admission: EM | Admit: 2019-02-09 | Discharge: 2019-02-09 | Disposition: A | Discharge status: Home / Self Care | Payer: BLUE CROSS/BLUE SHIELD | Attending: Emergency Medicine | Admitting: Emergency Medicine

## 2019-02-09 ENCOUNTER — Other Ambulatory Visit: Payer: Self-pay

## 2019-02-09 DIAGNOSIS — A64 Unspecified sexually transmitted disease: Secondary | ICD-10-CM | POA: Insufficient documentation

## 2019-02-09 DIAGNOSIS — Z79899 Other long term (current) drug therapy: Secondary | ICD-10-CM | POA: Insufficient documentation

## 2019-02-09 DIAGNOSIS — R369 Urethral discharge, unspecified: Secondary | ICD-10-CM

## 2019-02-09 DIAGNOSIS — F17200 Nicotine dependence, unspecified, uncomplicated: Secondary | ICD-10-CM | POA: Insufficient documentation

## 2019-02-09 DIAGNOSIS — Z7251 High risk heterosexual behavior: Secondary | ICD-10-CM

## 2019-02-09 LAB — URINALYSIS, COMPLETE (UACMP) WITH MICROSCOPIC
Bacteria, UA: NONE SEEN
Bilirubin Urine: NEGATIVE
Glucose, UA: NEGATIVE mg/dL
Hgb urine dipstick: NEGATIVE
Ketones, ur: NEGATIVE mg/dL
Leukocytes,Ua: NEGATIVE
Nitrite: NEGATIVE
Protein, ur: NEGATIVE mg/dL
Specific Gravity, Urine: 1.015 (ref 1.005–1.030)
pH: 6 (ref 5.0–8.0)

## 2019-02-09 MED ORDER — AZITHROMYCIN 250 MG PO TABS
1000.0000 mg | ORAL_TABLET | Freq: Once | ORAL | Status: AC
  Administered 2019-02-09: 1000 mg via ORAL
  Filled 2019-02-09: qty 4

## 2019-02-09 MED ORDER — STERILE WATER FOR INJECTION IJ SOLN
INTRAMUSCULAR | Status: AC
  Administered 2019-02-09: 0.9 mL
  Filled 2019-02-09: qty 10

## 2019-02-09 MED ORDER — CEFTRIAXONE SODIUM 250 MG IJ SOLR
250.0000 mg | Freq: Once | INTRAMUSCULAR | Status: AC
  Administered 2019-02-09: 250 mg via INTRAMUSCULAR
  Filled 2019-02-09: qty 250

## 2019-02-09 NOTE — ED Provider Notes (Signed)
[place] EMERGENCY DEPARTMENT Provider Note   CSN: [id number] Arrival date & time: 02/09/19  1637     History   Chief Complaint Chief Complaint  Patient presents with  . SEXUALLY TRANSMITTED DISEASE    HPI Jacob Gates is a 23 y.o. male.     Patient to ED with c/o penile discharge. No testicular pain, abdominal pain, fever, vomiting. His last sexual encounter was 10 days ago. History of STD with similar symptoms.   The history is provided by the patient. No language interpreter was used.    No past medical history on file.  There are no active problems to display for this patient.   No past surgical history on file.      Home Medications    Prior to Admission medications   Medication Sig Start Date End Date Taking? Authorizing Provider  Biotin 1 MG CAPS Take by mouth.    [provider]  loperamide (IMODIUM) 2 MG capsule Take 1 capsule (2 mg total) by mouth 4 (four) times daily as needed for diarrhea or loose stools. 09/25/18   Alroy Bailiff, Margaux, PA-C  naproxen sodium (ANAPROX) 220 MG tablet Take 440 mg by mouth 2 (two) times daily as needed (pain).    [provider]  omeprazole (PRILOSEC) 20 MG capsule Take 1 capsule (20 mg total) by mouth daily. 02/09/18   Zigmund Gottron, NP    Family History No family history on file.  Social History Social History   Tobacco Use  . Smoking status: Current Some Day Smoker  . Smokeless tobacco: Never Used  Substance Use Topics  . Alcohol use: No  . Drug use: Yes    Frequency: 4.0 times per week    Types: Marijuana     Allergies   Other   Review of Systems Review of Systems  Constitutional: Negative for fever.  Gastrointestinal: Negative for abdominal pain and vomiting.  Genitourinary: Positive for discharge. Negative for dysuria, scrotal swelling and testicular pain.  Skin: Negative for color change.     Physical Exam Updated Vital Signs BP 118/64   Pulse 72   Temp  97.6 F (36.4 C) (Oral)   Resp 18   SpO2 100%   Physical Exam Vitals signs and nursing note reviewed.  Constitutional:      Appearance: He is well-developed.  Neck:     Musculoskeletal: Normal range of motion.  Pulmonary:     Effort: Pulmonary effort is normal.  Genitourinary:    Comments: Circumcised. No visible penile discharge. No testicular tenderness. No inguinal lymphadenopathy. Musculoskeletal: Normal range of motion.  Skin:    General: Skin is warm and dry.  Neurological:     Mental Status: He is alert and oriented to person, place, and time.      ED Treatments / Results  Labs (all labs ordered are listed, but only abnormal results are displayed) Labs Reviewed  URINALYSIS, COMPLETE (UACMP) WITH MICROSCOPIC  GC/CHLAMYDIA PROBE AMP ([place]) NOT AT [facility]    EKG None  Radiology No results found.  Procedures Procedures (including critical care time)  Medications Ordered in ED Medications  azithromycin (ZITHROMAX) tablet 1,000 mg (1,000 mg Oral Given 02/09/19 1822)  cefTRIAXone (ROCEPHIN) injection 250 mg (250 mg Intramuscular Given 02/09/19 1823)  sterile water (preservative free) injection (0.9 mLs  Given 02/09/19 1825)     Initial Impression / Assessment and Plan / ED Course  I have reviewed the triage vital signs and the nursing notes.  Pertinent  labs & imaging results that were available during my care of the patient were reviewed by me and considered in my medical decision making (see chart for details).        Patient to ED with penile discharge. Multiple visits for STD treatment.   Discussed the [facility] as an alternative resource for treatment. Discussed the importance of condom use.   The patient is treated with Rocephin and zithromax and discharged.  Final Clinical Impressions(s) / ED Diagnoses   Final diagnoses:  None   1. STD  ED Discharge Orders    None       Elpidio Anis, PA-C 02/09/19 1833    Sabas Sous,  MD 02/10/19 1500

## 2019-02-09 NOTE — ED Notes (Signed)
Patient verbalizes understanding of discharge instructions. Opportunity for questioning and answers were provided. Armband removed by staff, pt discharged from ED ambulatory.   

## 2019-02-09 NOTE — ED Triage Notes (Signed)
Pt endorsees penile d/c x 2 weeks and has hx of GC/chlamydia and this feels the same. VSS

## 2019-02-12 LAB — GC/CHLAMYDIA PROBE AMP (~~LOC~~) NOT AT ARMC
Chlamydia: NEGATIVE
Neisseria Gonorrhea: NEGATIVE

## 2019-04-12 ENCOUNTER — Ambulatory Visit: Payer: BLUE CROSS/BLUE SHIELD | Attending: Internal Medicine

## 2019-04-12 DIAGNOSIS — Z20822 Contact with and (suspected) exposure to covid-19: Secondary | ICD-10-CM

## 2019-04-13 LAB — NOVEL CORONAVIRUS, NAA: SARS-CoV-2, NAA: NOT DETECTED

## 2020-01-03 IMAGING — CT CT ANGIO CHEST
2 of 7 series · 19 of 36 positions shown · IV contrast (iopamidol)
Comparison: None.

CLINICAL DATA: Difficulty breathing, mucus with congestion x7
hours. Unable cough.

EXAM:
CT ANGIOGRAPHY CHEST WITH CONTRAST
TECHNIQUE: Multidetector CT imaging of the chest was performed using the
standard protocol during bolus administration of intravenous
contrast. Multiplanar CT image reconstructions and MIPs were
obtained to evaluate the vascular anatomy.
CONTRAST:  100mL 16MUIO-HKL IOPAMIDOL (16MUIO-HKL) INJECTION 76%

[Series 6: pe coronal mpr · coronal · 0.56mm/px · 1 of 111 slices shown]
[im 56/111  mediastinal]
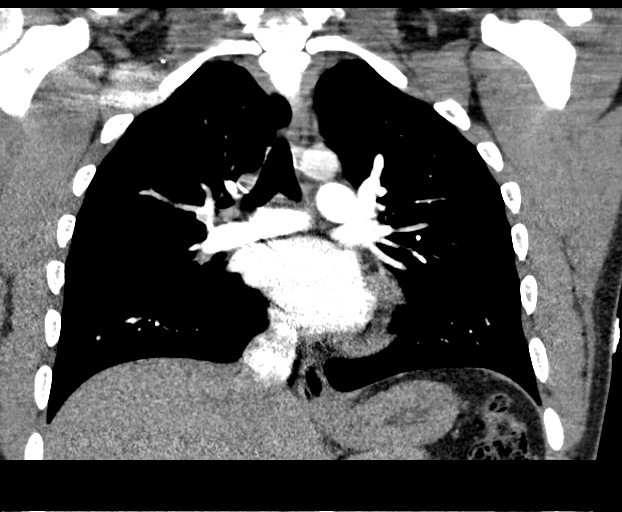

[Series 10: pe thins · axial · 0.69mm/px · z∈[-97,+141]mm · 18 of 266 slices shown]
[im 14/266  lung]
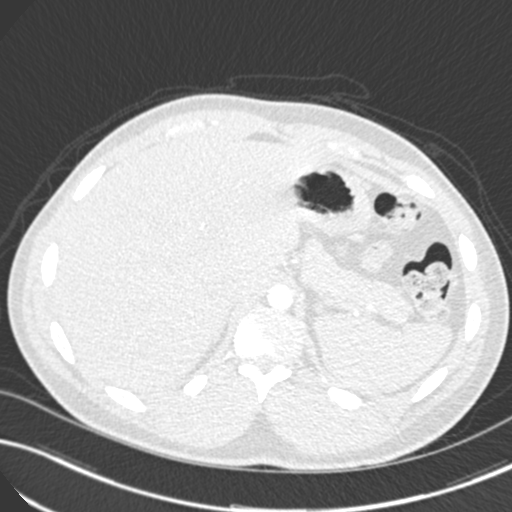
[im 28/266  mediastinal]
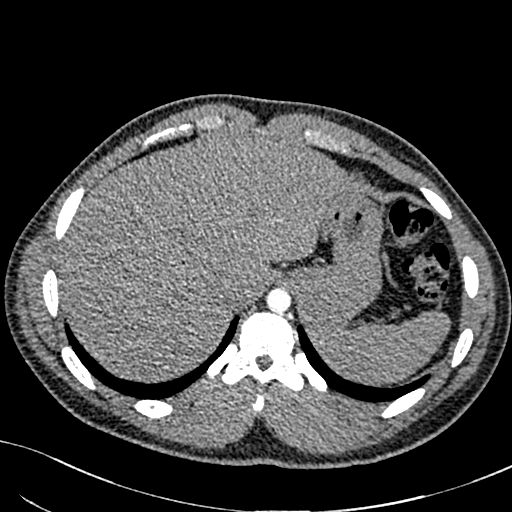
[im 42/266  lung]
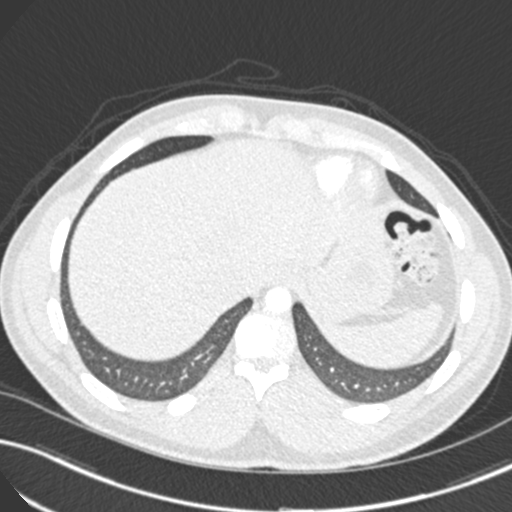
[im 56/266  mediastinal]
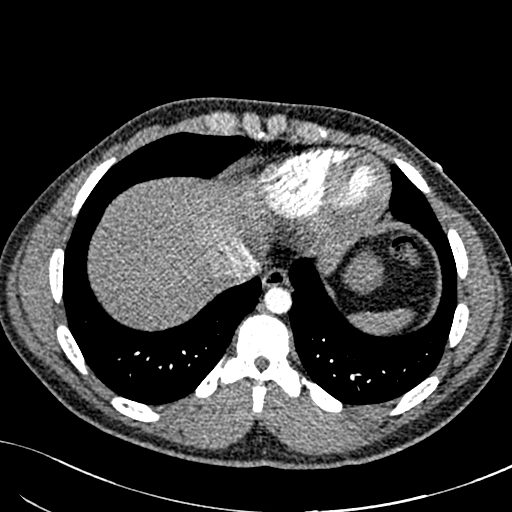
[im 70/266  lung]
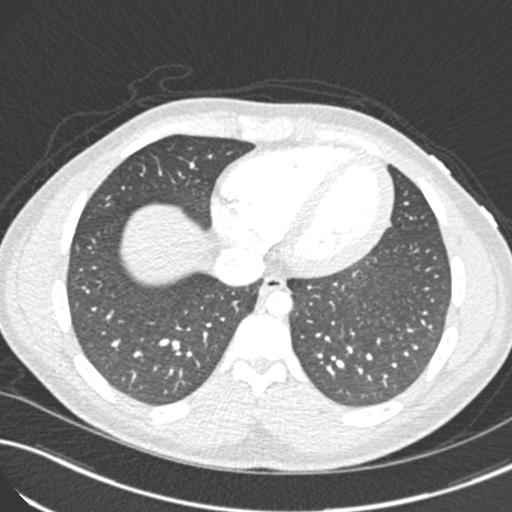
[im 84/266  mediastinal]
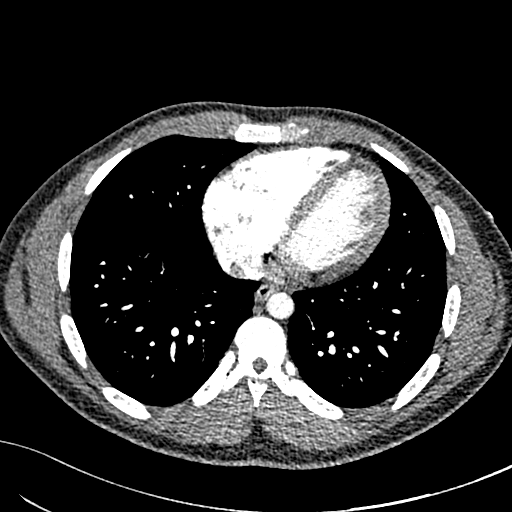
[im 98/266  lung]
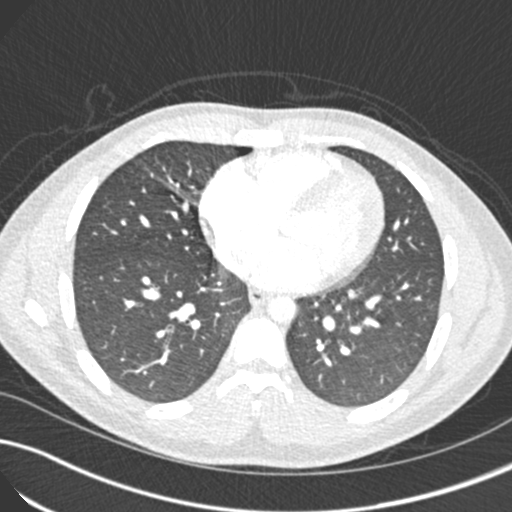
[im 112/266  mediastinal]
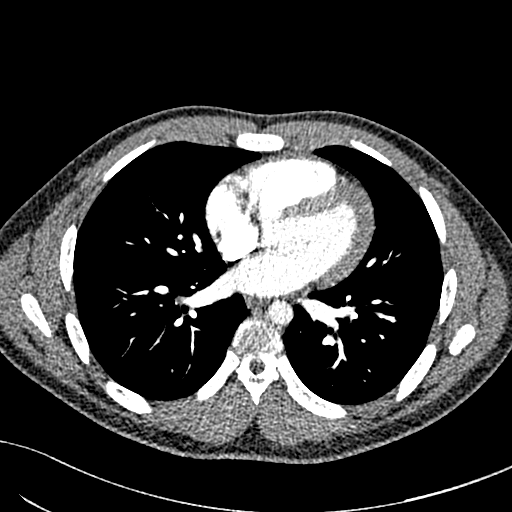
[im 126/266  lung]
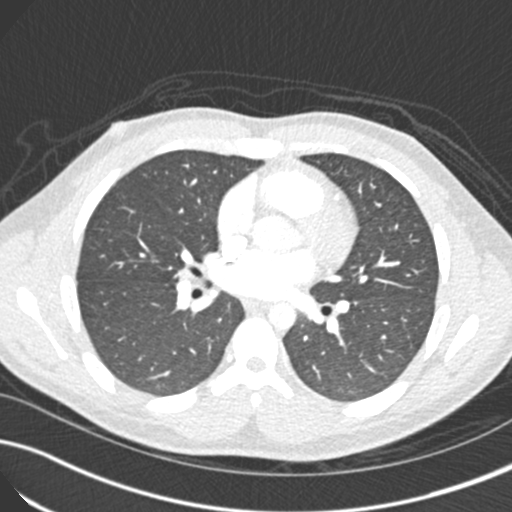
[im 140/266  mediastinal]
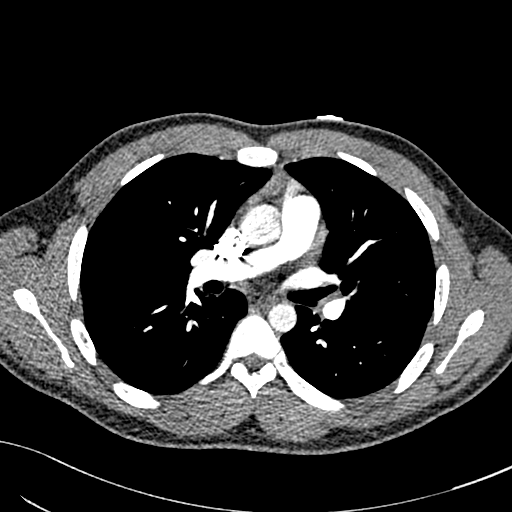
[im 154/266  lung]
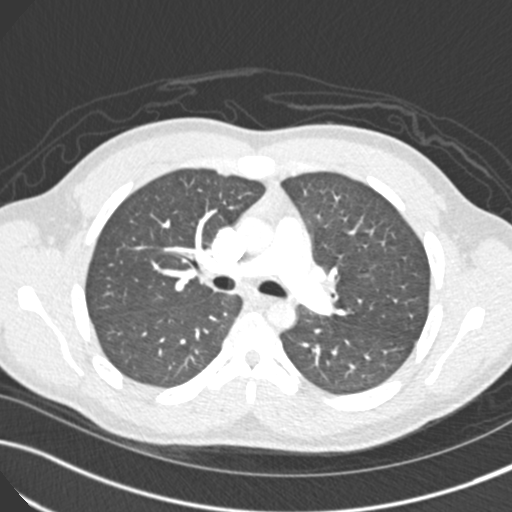
[im 168/266  mediastinal]
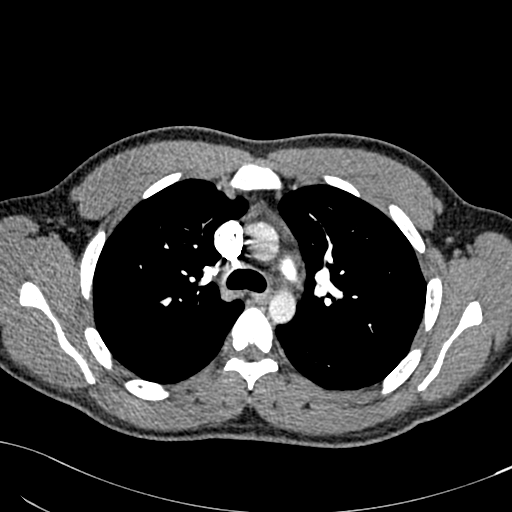
[im 182/266  lung]
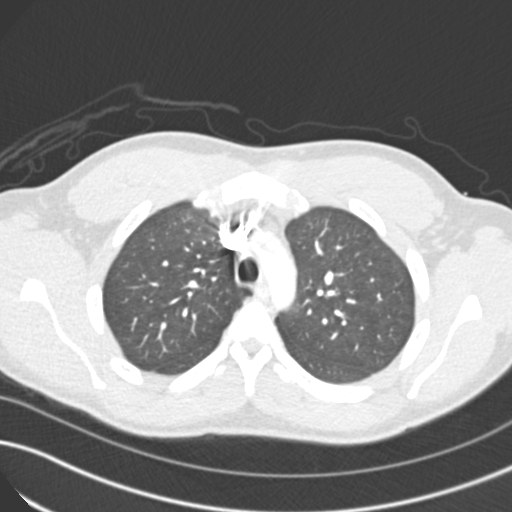
[im 196/266  mediastinal]
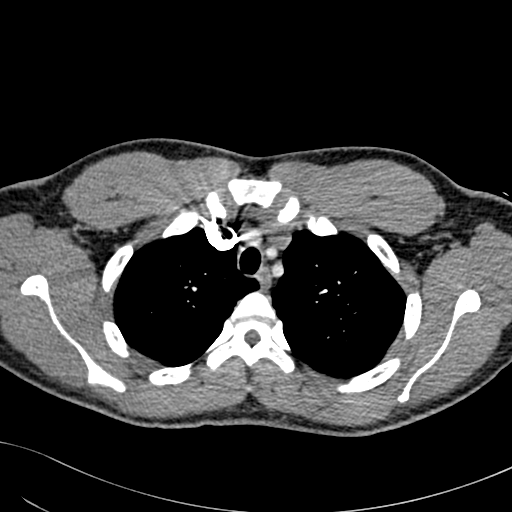
[im 210/266  lung]
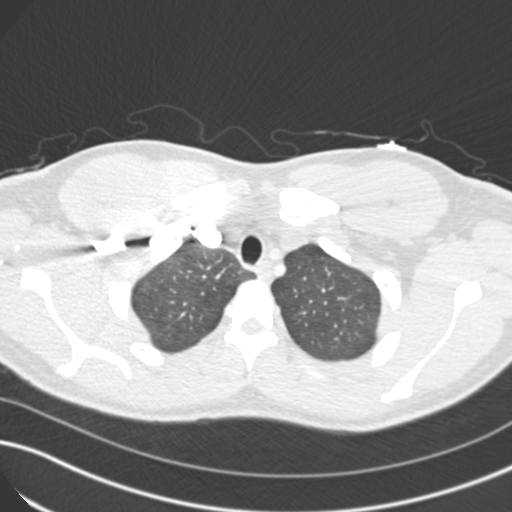
[im 224/266  mediastinal]
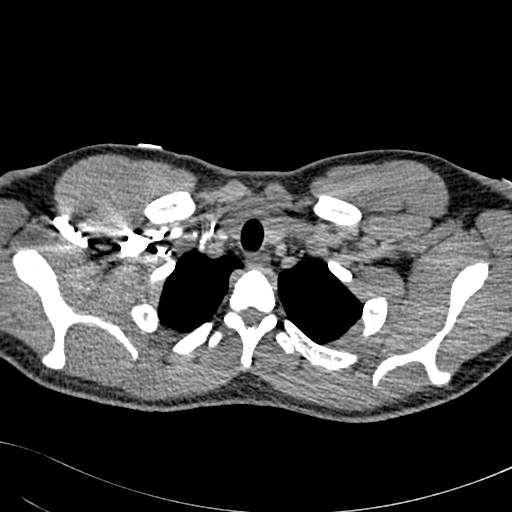
[im 238/266  lung]
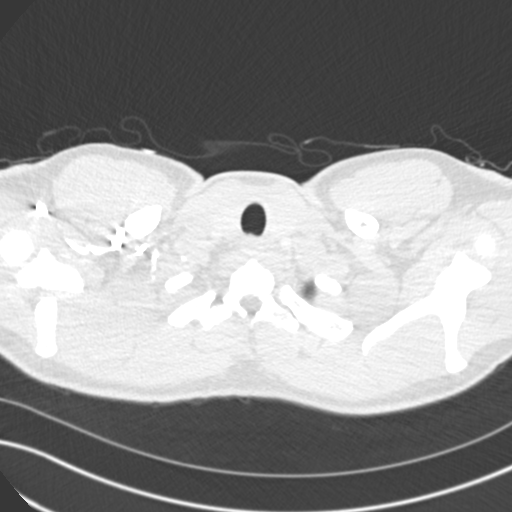
[im 252/266  mediastinal]
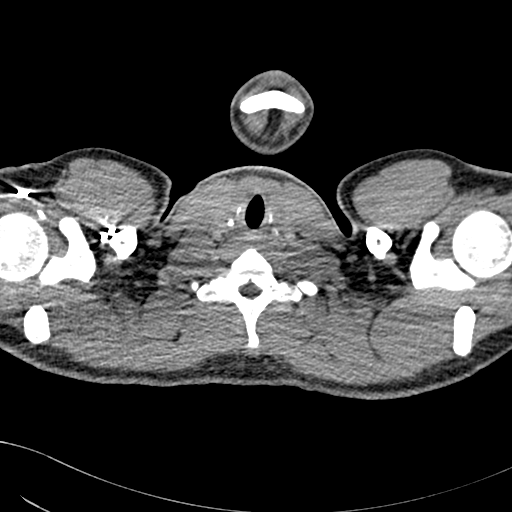

[19 of 36 positions shown; findings below may reference images not displayed]

FINDINGS: Cardiovascular: The study is of quality for the evaluation of
pulmonary embolism. There are no filling defects in the central,
lobar, segmental or subsegmental pulmonary artery branches to
suggest acute pulmonary embolism. Great vessels are normal in course
and caliber. Normal heart size. No significant pericardial
fluid/thickening.

Mediastinum/Nodes: No discrete thyroid nodules. Unremarkable
esophagus. No pathologically enlarged axillary, mediastinal or hilar
lymph nodes.

Lungs/Pleura: No pneumothorax. No pleural effusion. Lungs are clear
without air trapping. No peribronchial thickening is identified. No
mucus inspissation is seen.

Upper abdomen: Unremarkable.

Musculoskeletal:  No aggressive appearing focal osseous lesions.

Review of the MIP images confirms the above findings.
IMPRESSION: No pulmonary embolus. No acute pulmonary edema or consolidation. No
pneumothorax.

## 2023-07-17 ENCOUNTER — Encounter (HOSPITAL_COMMUNITY): Payer: Self-pay | Admitting: *Deleted

## 2023-07-17 ENCOUNTER — Ambulatory Visit (HOSPITAL_COMMUNITY)
Admission: EM | Admit: 2023-07-17 | Discharge: 2023-07-17 | Disposition: A | Discharge status: Home / Self Care | Payer: Self-pay | Attending: Family Medicine | Admitting: Family Medicine

## 2023-07-17 DIAGNOSIS — R369 Urethral discharge, unspecified: Secondary | ICD-10-CM | POA: Insufficient documentation

## 2023-07-17 MED ORDER — CEFTRIAXONE SODIUM 500 MG IJ SOLR
INTRAMUSCULAR | Status: AC
  Filled 2023-07-17: qty 500

## 2023-07-17 MED ORDER — DOXYCYCLINE HYCLATE 100 MG PO CAPS: 100.0000 mg | ORAL_CAPSULE | Freq: Two times a day (BID) | ORAL | 0 refills | Status: AC

## 2023-07-17 MED ORDER — LIDOCAINE HCL (PF) 1 % IJ SOLN
INTRAMUSCULAR | Status: AC
  Filled 2023-07-17: qty 2

## 2023-07-17 MED ORDER — CEFTRIAXONE SODIUM 500 MG IJ SOLR
500.0000 mg | Freq: Once | INTRAMUSCULAR | Status: AC
  Administered 2023-07-17: 500 mg via INTRAMUSCULAR

## 2023-07-17 NOTE — ED Triage Notes (Addendum)
 C/O penile discharge over past couple days, along with transient penile pain.

## 2023-07-17 NOTE — Discharge Instructions (Signed)

## 2023-07-17 NOTE — ED Provider Notes (Signed)
  [facility] CARE CENTER   [id number] 07/17/23 Arrival Time: 1101  ASSESSMENT & PLAN:  1. Penile discharge    Meds ordered this encounter  Medications   doxycycline  (VIBRAMYCIN ) 100 MG capsule    Sig: Take 1 capsule (100 mg total) by mouth 2 (two) times daily.    Dispense:  14 capsule    Refill:  0   cefTRIAXone  (ROCEPHIN ) injection 500 mg    Antibiotic Indication::   STD      Discharge Instructions      You have been given the following today for treatment of suspected gonorrhea and/or chlamydia:  cefTRIAXone  (ROCEPHIN ) injection 500 mg  Please pick up your prescription for doxycycline  100 mg and begin taking twice daily for the next seven (7) days.  Even though we have treated you today, we have sent testing for sexually transmitted infections. We will notify you of any positive results once they are received. If required, we will prescribe any medications you might need.  Please refrain from all sexual activity for at least the next seven days.     Pending: Labs Reviewed  CYTOLOGY, (ORAL, ANAL, URETHRAL) ANCILLARY ONLY    Will notify of any positive results. Instructed to refrain from sexual activity for at least seven days.  Reviewed expectations re: course of current medical issues. Questions answered. Outlined signs and symptoms indicating need for more acute intervention. Patient verbalized understanding. After Visit Summary given.   SUBJECTIVE:  Jacob Gates is a 28 y.o. male who presents with complaint of penile discharge. Onset gradual. First noticed  over past couple of days . Describes discharge as thick and opaque. No specific aggravating or alleviating factors reported. Denies: urinary frequency, dysuria, and gross hematuria. Afebrile. No abdominal or pelvic pain. No n/v. No rashes or lesions. Is sexually active.  OBJECTIVE:  Vitals:   07/17/23 1216  BP: 136/85  Pulse: 72  Resp: 16  Temp: 98.1 F (36.7 C)  TempSrc: Oral  SpO2: 98%     General appearance: alert, cooperative, appears stated age and no distress GU: deferred Skin: warm and dry Psychological: alert and cooperative; normal mood and affect.    Labs Reviewed  CYTOLOGY, (ORAL, ANAL, URETHRAL) ANCILLARY ONLY    Allergies  Allergen Reactions   Other Swelling    Ants, spiders and bees    History reviewed. No pertinent past medical history. History reviewed. No pertinent family history. Social History   Socioeconomic History   Marital status: Single    Spouse name: Not on file   Number of children: Not on file   Years of education: Not on file   Highest education level: Not on file  Occupational History   Not on file  Tobacco Use   Smoking status: Never   Smokeless tobacco: Never  Vaping Use   Vaping status: Never Used  Substance and Sexual Activity   Alcohol use: Yes    Comment: occasionally   Drug use: Yes    Types: Marijuana   Sexual activity: Yes  Other Topics Concern   Not on file  Social History Narrative   Not on file   Social Drivers of Health   Financial Resource Strain: Not on file  Food Insecurity: Not on file  Transportation Needs: Not on file  Physical Activity: Not on file  Stress: Not on file  Social Connections: Not on file  Intimate Partner Violence: Not on file           Afton Albright, MD 07/17/23 1322

## 2023-07-18 LAB — CYTOLOGY, (ORAL, ANAL, URETHRAL) ANCILLARY ONLY
Chlamydia: POSITIVE — AB
Comment: NEGATIVE
Comment: NEGATIVE
Comment: NORMAL
Neisseria Gonorrhea: NEGATIVE
Trichomonas: NEGATIVE
